# Patient Record
Sex: Male | Born: 2005
Health system: Southern US, Community
[De-identification: ages and names within clinical notes are randomized; demographics above are authoritative.]

## PROBLEM LIST (undated history)

## (undated) HISTORY — PX: TONSILLECTOMY: SUR1361

## (undated) HISTORY — PX: TYMPANOSTOMY TUBE PLACEMENT: SHX32

## (undated) HISTORY — PX: ADENOIDECTOMY: SUR15

---
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
## 2023-12-15 ENCOUNTER — Encounter: Admitting: Nurse Practitioner

## 2023-12-15 NOTE — Progress Notes (Deleted)
 There were no vitals taken for this visit.   Subjective:    Patient ID: Kevin Snyder, male    DOB: 06-21-2005, 18 y.o.   MRN: [id number]  HPI: Kevin Snyder is a 18 y.o. male presenting on 12/15/2023 for comprehensive medical examination. Current medical complaints include:{Blank single:19197::none,***}  He currently lives with: Interim Problems from his last visit: {Blank single:19197::yes,no}  Depression Screen done today and results listed below:     12/12/2022    8:27 AM 10/31/2021    3:37 PM  Depression screen PHQ 2/9  Decreased Interest 0 0  Down, Depressed, Hopeless 0 0  PHQ - 2 Score 0 0  Altered sleeping 0 0  Tired, decreased energy 0 0  Change in appetite 0 0  Feeling bad or failure about yourself  0 0  Trouble concentrating 0 0  Moving slowly or fidgety/restless 0 0  Suicidal thoughts 0 0  PHQ-9 Score 0 0  Difficult doing work/chores Not difficult at all Not difficult at all    The patient {has/does not have:19849} a history of falls. I {did/did not:19850} complete a risk assessment for falls. A plan of care for falls {was/was not:19852} documented.   Past Medical History:  No past medical history on file.  Surgical History:  Past Surgical History:  Procedure Laterality Date   ADENOIDECTOMY     TONSILLECTOMY     TYMPANOSTOMY TUBE PLACEMENT      Medications:  Current Outpatient Medications on File Prior to Visit  Medication Sig   meloxicam (MOBIC) 15 MG tablet Take 15 mg by mouth daily. (Patient not taking: Reported on 10/31/2021)   No current facility-administered medications on file prior to visit.    Allergies:  No Known Allergies  Social History:  Social History   Socioeconomic History   Marital status: Single    Spouse name: Not on file   Number of children: Not on file   Years of education: Not on file   Highest education level: Not on file  Occupational History   Not on file  Tobacco Use   Smoking status: Never   Smokeless  tobacco: Never  Vaping Use   Vaping status: Never Used  Substance and Sexual Activity   Alcohol use: No    Alcohol/week: 0.0 standard drinks of alcohol   Drug use: No   Sexual activity: Not Currently  Other Topics Concern   Not on file  Social History Narrative   Not on file   Social Drivers of Health   Financial Resource Strain: Not on file  Food Insecurity: Not on file  Transportation Needs: Not on file  Physical Activity: Not on file  Stress: Not on file  Social Connections: Not on file  Intimate Partner Violence: Not on file   Social History   Tobacco Use  Smoking Status Never  Smokeless Tobacco Never   Social History   Substance and Sexual Activity  Alcohol Use No   Alcohol/week: 0.0 standard drinks of alcohol    Family History:  Family History  Problem Relation Age of Onset   Rheum arthritis Maternal Grandfather    Hypertension Maternal Grandfather    Hypertension Maternal Grandmother     Past medical history, surgical history, medications, allergies, family history and social history reviewed with patient today and changes made to appropriate areas of the chart.   ROS All other ROS negative except what is listed above and in the HPI.      Objective:  There were no vitals taken for this visit.  Wt Readings from Last 3 Encounters:  12/12/22 254 lb 12.8 oz (115.6 kg) (>99%, Z= 2.49)*  10/31/21 (!) 236 lb 6.4 oz (107.2 kg) (99%, Z= 2.29)*  07/05/21 (!) 244 lb 9.6 oz (110.9 kg) (>99%, Z= 2.45)*   * Growth percentiles are based on CDC (Boys, 2-20 Years) data.    Physical Exam  Results for orders placed or performed in visit on 12/12/22  Comp Met (CMET)   Collection Time: 12/12/22  8:57 AM  Result Value Ref Range   Glucose 90 70 - 99 mg/dL   BUN 16 6 - 20 mg/dL   Creatinine, Ser 9.15 0.76 - 1.27 mg/dL   eGFR 869 >40 fO/fpw/8.26   BUN/Creatinine Ratio 19 9 - 20   Sodium 143 134 - 144 mmol/L   Potassium 4.2 3.5 - 5.2 mmol/L   Chloride 108 (H)  96 - 106 mmol/L   CO2 21 20 - 29 mmol/L   Calcium 9.8 8.7 - 10.2 mg/dL   Total Protein 6.5 6.0 - 8.5 g/dL   Albumin 4.6 4.3 - 5.2 g/dL   Globulin, Total 1.9 1.5 - 4.5 g/dL   Bilirubin Total <9.7 0.0 - 1.2 mg/dL   Alkaline Phosphatase 87 51 - 125 IU/L   AST 24 0 - 40 IU/L   ALT 27 0 - 44 IU/L  CBC w/Diff   Collection Time: 12/12/22  8:57 AM  Result Value Ref Range   WBC 8.4 3.4 - 10.8 x10E3/uL   RBC 5.19 4.14 - 5.80 x10E6/uL   Hemoglobin 15.5 13.0 - 17.7 g/dL   Hematocrit 54.2 62.4 - 51.0 %   MCV 88 79 - 97 fL   MCH 29.9 26.6 - 33.0 pg   MCHC 33.9 31.5 - 35.7 g/dL   RDW 87.2 88.3 - 84.5 %   Platelets 221 150 - 450 x10E3/uL   Neutrophils 50 Not Estab. %   Lymphs 35 Not Estab. %   Monocytes 9 Not Estab. %   Eos 5 Not Estab. %   Basos 1 Not Estab. %   Neutrophils Absolute 4.2 1.4 - 7.0 x10E3/uL   Lymphocytes Absolute 2.9 0.7 - 3.1 x10E3/uL   Monocytes Absolute 0.8 0.1 - 0.9 x10E3/uL   EOS (ABSOLUTE) 0.4 0.0 - 0.4 x10E3/uL   Basophils Absolute 0.1 0.0 - 0.2 x10E3/uL   Immature Granulocytes 0 Not Estab. %   Immature Grans (Abs) 0.0 0.0 - 0.1 x10E3/uL  Lipid Profile   Collection Time: 12/12/22  8:57 AM  Result Value Ref Range   Cholesterol, Total 166 100 - 169 mg/dL   Triglycerides 90 (H) 0 - 89 mg/dL   HDL 44 >60 mg/dL   VLDL Cholesterol Cal 17 5 - 40 mg/dL   LDL Chol Calc (NIH) 894 0 - 109 mg/dL   Chol/HDL Ratio 3.8 0.0 - 5.0 ratio  TSH   Collection Time: 12/12/22  8:57 AM  Result Value Ref Range   TSH 3.480 0.450 - 4.500 uIU/mL  HgB A1c   Collection Time: 12/12/22  8:57 AM  Result Value Ref Range   Hgb A1c MFr Bld 5.3 4.8 - 5.6 %   Est. average glucose Bld gHb Est-mCnc 105 mg/dL  HIV antibody (with reflex)   Collection Time: 12/12/22  8:57 AM  Result Value Ref Range   HIV Screen 4th Generation wRfx Non Reactive Non Reactive  Hepatitis C antibody   Collection Time: 12/12/22  8:57 AM  Result Value Ref Range  Hep C Virus Ab Non Reactive Non Reactive   QuantiFERON-TB Gold Plus   Collection Time: 12/12/22  8:57 AM  Result Value Ref Range   QuantiFERON Incubation Incubation performed.    QuantiFERON Criteria Comment    QuantiFERON TB1 Ag Value 0.04 IU/mL   QuantiFERON TB2 Ag Value 0.04 IU/mL   QuantiFERON Nil Value 0.02 IU/mL   QuantiFERON Mitogen Value >10.00 IU/mL   QuantiFERON-TB Gold Plus Negative Negative      Assessment & Plan:   Problem List Items Addressed This Visit   None    Discussed aspirin prophylaxis for myocardial infarction prevention and decision was {Blank single:19197::it was not indicated,made to continue ASA,made to start ASA,made to stop ASA,that we recommended ASA, and patient refused}  LABORATORY TESTING:  Health maintenance labs ordered today as discussed above.   The natural history of prostate cancer and ongoing controversy regarding screening and potential treatment outcomes of prostate cancer has been discussed with the patient. The meaning of a false positive PSA and a false negative PSA has been discussed. He indicates understanding of the limitations of this screening test and wishes *** to proceed with screening PSA testing.   IMMUNIZATIONS:   - Tdap: Tetanus vaccination status reviewed: {tetanus status:315746}. - Influenza: {Blank single:19197::Up to date,Administered today,Postponed to flu season,Refused,Given elsewhere} - Pneumovax: {Blank single:19197::Up to date,Administered today,Not applicable,Refused,Given elsewhere} - Prevnar: {Blank single:19197::Up to date,Administered today,Not applicable,Refused,Given elsewhere} - COVID: {Blank single:19197::Up to date,Administered today,Not applicable,Refused,Given elsewhere} - HPV: {Blank single:19197::Up to date,Administered today,Not applicable,Refused,Given elsewhere} - Shingrix vaccine: {Blank single:19197::Up to date,Administered today,Not applicable,Refused,Given  elsewhere}  SCREENING: - Colonoscopy: {Blank single:19197::Up to date,Ordered today,Not applicable,Refused,Done elsewhere}  Discussed with patient purpose of the colonoscopy is to detect colon cancer at curable precancerous or early stages   - AAA Screening: {Blank single:19197::Up to date,Ordered today,Not applicable,Refused,Done elsewhere}  -Hearing Test: {Blank single:19197::Up to date,Ordered today,Not applicable,Refused,Done elsewhere}  -Spirometry: {Blank single:19197::Up to date,Ordered today,Not applicable,Refused,Done elsewhere}   PATIENT COUNSELING:    Sexuality: Discussed sexually transmitted diseases, partner selection, use of condoms, avoidance of unintended pregnancy  and contraceptive alternatives.   Advised to avoid cigarette smoking.  I discussed with the patient that most people either abstain from alcohol or drink within safe limits (<=14/week and <=4 drinks/occasion for males, <=7/weeks and <= 3 drinks/occasion for females) and that the risk for alcohol disorders and other health effects rises proportionally with the number of drinks per week and how often a drinker exceeds daily limits.  Discussed cessation/primary prevention of drug use and availability of treatment for abuse.   Diet: Encouraged to adjust caloric intake to maintain  or achieve ideal body weight, to reduce intake of dietary saturated fat and total fat, to limit sodium intake by avoiding high sodium foods and not adding table salt, and to maintain adequate dietary potassium and calcium preferably from fresh fruits, vegetables, and low-fat dairy products.    stressed the importance of regular exercise  Injury prevention: Discussed safety belts, safety helmets, smoke detector, smoking near bedding or upholstery.   Dental health: Discussed importance of regular tooth brushing, flossing, and dental visits.   Follow up plan: NEXT PREVENTATIVE PHYSICAL DUE IN 1  YEAR. No follow-ups on file.
